# Patient Record
Sex: Male | Born: 1990 | Hispanic: Yes | Marital: Married | Smoking: Never smoker
Health system: Southern US, Community
[De-identification: ages and names within clinical notes are randomized; demographics above are authoritative.]

## PROBLEM LIST (undated history)

## (undated) DIAGNOSIS — H9190 Unspecified hearing loss, unspecified ear: Secondary | ICD-10-CM

## (undated) HISTORY — PX: HERNIA REPAIR: SHX51

## (undated) HISTORY — PX: ABDOMINAL SURGERY: SHX537

---

## 2013-03-22 ENCOUNTER — Emergency Department (HOSPITAL_COMMUNITY)
Admission: EM | Admit: 2013-03-22 | Discharge: 2013-03-22 | Disposition: A | Discharge status: Home / Self Care | Payer: Medicaid Other | Attending: Emergency Medicine | Admitting: Emergency Medicine

## 2013-03-22 DIAGNOSIS — X131XXA Other contact with steam and other hot vapors, initial encounter: Secondary | ICD-10-CM | POA: Insufficient documentation

## 2013-03-22 DIAGNOSIS — Y9389 Activity, other specified: Secondary | ICD-10-CM | POA: Insufficient documentation

## 2013-03-22 DIAGNOSIS — T2640XA Burn of unspecified eye and adnexa, part unspecified, initial encounter: Secondary | ICD-10-CM | POA: Insufficient documentation

## 2013-03-22 DIAGNOSIS — Y9289 Other specified places as the place of occurrence of the external cause: Secondary | ICD-10-CM | POA: Insufficient documentation

## 2013-03-22 DIAGNOSIS — X12XXXA Contact with other hot fluids, initial encounter: Secondary | ICD-10-CM | POA: Insufficient documentation

## 2013-03-22 DIAGNOSIS — T2010XA Burn of first degree of head, face, and neck, unspecified site, initial encounter: Secondary | ICD-10-CM

## 2013-03-22 MED ORDER — OXYCODONE-ACETAMINOPHEN 5-325 MG PO TABS
1.0000 | ORAL_TABLET | Freq: Once | ORAL | Status: AC
  Administered 2013-03-22: 1 via ORAL
  Filled 2013-03-22: qty 1

## 2013-03-22 MED ORDER — TETRACAINE HCL 0.5 % OP SOLN
2.0000 [drp] | Freq: Once | OPHTHALMIC | Status: AC
  Administered 2013-03-22: 2 [drp] via OPHTHALMIC
  Filled 2013-03-22: qty 2

## 2013-03-22 MED ORDER — HYDROCODONE-ACETAMINOPHEN 5-325 MG PO TABS: 1.0000 | ORAL_TABLET | Freq: Four times a day (QID) | ORAL | Status: DC | PRN

## 2013-03-22 MED ORDER — FLUORESCEIN SODIUM 1 MG OP STRP
1.0000 | ORAL_STRIP | Freq: Once | OPHTHALMIC | Status: AC
  Administered 2013-03-22: 20:00:00 via OPHTHALMIC
  Filled 2013-03-22: qty 1

## 2013-03-22 NOTE — ED Notes (Signed)
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  C/o pain under his eyes and above.  Redness no blistering yet.  Wife at the bedside.  She is also deaf .
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
## 2013-03-22 NOTE — ED Notes (Signed)
Family at beside. Family given emotional support. 

## 2013-03-22 NOTE — ED Notes (Signed)
The pt reports that his pain is better after the percocet.  Waiting for a reevaluation

## 2013-03-22 NOTE — ED Notes (Signed)
Family updated as to patient's status.
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
## 2013-03-22 NOTE — ED Notes (Signed)
Pain med given and material to check his eyes at the bedside

## 2013-03-22 NOTE — ED Notes (Signed)
Vital signs stable. 

## 2014-01-24 ENCOUNTER — Encounter (HOSPITAL_COMMUNITY): Payer: Self-pay | Admitting: Emergency Medicine

## 2014-01-24 ENCOUNTER — Emergency Department (HOSPITAL_COMMUNITY): Payer: Medicaid Other

## 2014-01-24 ENCOUNTER — Emergency Department (HOSPITAL_COMMUNITY)
Admission: EM | Admit: 2014-01-24 | Discharge: 2014-01-24 | Disposition: A | Discharge status: Home / Self Care | Payer: Medicaid Other | Attending: Emergency Medicine | Admitting: Emergency Medicine

## 2014-01-24 DIAGNOSIS — R519 Headache, unspecified: Secondary | ICD-10-CM

## 2014-01-24 DIAGNOSIS — R51 Headache: Secondary | ICD-10-CM | POA: Insufficient documentation

## 2014-01-24 DIAGNOSIS — H9190 Unspecified hearing loss, unspecified ear: Secondary | ICD-10-CM | POA: Insufficient documentation

## 2014-01-24 HISTORY — DX: Unspecified hearing loss, unspecified ear: H91.90

## 2014-01-24 MED ORDER — PROMETHAZINE HCL 25 MG PO TABS
25.0000 mg | ORAL_TABLET | Freq: Once | ORAL | Status: AC
  Administered 2014-01-24: 25 mg via ORAL
  Filled 2014-01-24: qty 1

## 2014-01-24 MED ORDER — KETOROLAC TROMETHAMINE 60 MG/2ML IM SOLN
60.0000 mg | Freq: Once | INTRAMUSCULAR | Status: AC
  Administered 2014-01-24: 60 mg via INTRAMUSCULAR
  Filled 2014-01-24: qty 2

## 2014-01-24 MED ORDER — OXYCODONE-ACETAMINOPHEN 5-325 MG PO TABS
1.0000 | ORAL_TABLET | Freq: Once | ORAL | Status: AC
Start: 2014-01-24 — End: 2014-01-24
  Administered 2014-01-24: 1 via ORAL
  Filled 2014-01-24: qty 1

## 2014-01-24 MED ORDER — TRAMADOL HCL 50 MG PO TABS: 50.0000 mg | ORAL_TABLET | Freq: Four times a day (QID) | ORAL | Status: DC | PRN

## 2014-01-24 MED ORDER — OXYCODONE-ACETAMINOPHEN 5-325 MG PO TABS
1.0000 | ORAL_TABLET | Freq: Once | ORAL | Status: AC
  Administered 2014-01-24: 1 via ORAL
  Filled 2014-01-24: qty 1

## 2014-01-24 NOTE — Discharge Instructions (Signed)
Migraine Headache A migraine headache is an intense, throbbing pain on one or both sides of your head. A migraine can last for 30 minutes to several hours. CAUSES  The exact cause of a migraine headache is not always known. However, a migraine may be caused when nerves in the brain become irritated and release chemicals that cause inflammation. This causes pain. Certain things may also trigger migraines, such as:  Alcohol.  Smoking.  Stress.  Menstruation.  Aged cheeses.  Foods or drinks that contain nitrates, glutamate, aspartame, or tyramine.  Lack of sleep.  Chocolate.  Caffeine.  Hunger.  Physical exertion.  Fatigue.  Medicines used to treat chest pain (nitroglycerine), birth control pills, estrogen, and some blood pressure medicines. SIGNS AND SYMPTOMS  Pain on one or both sides of your head.  Pulsating or throbbing pain.  Severe pain that prevents daily activities.  Pain that is aggravated by any physical activity.  Nausea, vomiting, or both.  Dizziness.  Pain with exposure to bright lights, loud noises, or activity.  General sensitivity to bright lights, loud noises, or smells. Before you get a migraine, you may get warning signs that a migraine is coming (aura). An aura may include:  Seeing flashing lights.  Seeing bright spots, halos, or zig-zag lines.  Having tunnel vision or blurred vision.  Having feelings of numbness or tingling.  Having trouble talking.  Having muscle weakness. DIAGNOSIS  A migraine headache is often diagnosed based on:  Symptoms.  Physical exam.  A CT scan or MRI of your head. These imaging tests cannot diagnose migraines, but they can help rule out other causes of headaches. TREATMENT Medicines may be given for pain and nausea. Medicines can also be given to help prevent recurrent migraines.  HOME CARE INSTRUCTIONS  Only take over-the-counter or prescription medicines for pain or discomfort as directed by your  health care provider. The use of long-term narcotics is not recommended.  Lie down in a dark, quiet room when you have a migraine.  Keep a journal to find out what may trigger your migraine headaches. For example, write down:  What you eat and drink.  How much sleep you get.  Any change to your diet or medicines.  Limit alcohol consumption.  Quit smoking if you smoke.  Get 7 9 hours of sleep, or as recommended by your health care provider.  Limit stress.  Keep lights dim if bright lights bother you and make your migraines worse. SEEK IMMEDIATE MEDICAL CARE IF:   Your migraine becomes severe.  You have a fever.  You have a stiff neck.  You have vision loss.  You have muscular weakness or loss of muscle control.  You start losing your balance or have trouble walking.  You feel faint or pass out.  You have severe symptoms that are different from your first symptoms. MAKE SURE YOU:   Understand these instructions.  Will watch your condition.  Will get help right away if you are not doing well or get worse. Document Released: 11/30/2005 Document Revised: 09/20/2013 Document Reviewed: 08/07/2013 Exit[employer] Patient Information 2014 [employer].  

## 2014-01-24 NOTE — ED Provider Notes (Signed)
CSN: [id number]     Arrival date & time 01/24/14  2005 History   First MD Initiated Contact with Patient 01/24/14 2130     Chief Complaint  Patient presents with  . Headache     (Consider location/radiation/quality/duration/timing/severity/associated sxs/prior Treatment) Patient is a 23 y.o. male presenting with headaches.  Headache Pain location:  L parietal and R parietal Quality:  Unable to specify Radiates to:  Does not radiate Severity currently:  3/10 Severity at highest:  5/10 Onset quality:  Gradual Duration:  2 days Timing:  Constant Progression:  Unchanged Chronicity:  New Similar to prior headaches: no   Context: not activity, not caffeine, not coughing, not eating, not intercourse and not loud noise   Relieved by:  None tried Worsened by:  Nothing tried Ineffective treatments:  None tried Associated symptoms: no abdominal pain, no back pain, no blurred vision, no cough, no ear pain, no facial pain, no fever, no nausea, no near-syncope, no neck stiffness, no numbness, no photophobia, no seizures and no weakness    Pt deaf: Patient presents to the ER with complaints of headache for the past few days. He is normally healthy and does not usually have headaches. A ASL intepretor is present through the whole interview and physical exam. He otherwise feels well and has not been affected by the headache. His girlfriend is the reasons he came, She says her mother has migraines and his headache is different then hers so she felt he needs to get checked out.  Past Medical History  Diagnosis Date  . Deaf    History reviewed. No pertinent past surgical history. No family history on file. History  Substance Use Topics  . Smoking status: Never Smoker   . Smokeless tobacco: Not on file  . Alcohol Use: No    Review of Systems  Constitutional: Negative for fever.  HENT: Negative for ear pain.   Eyes: Negative for blurred vision and photophobia.  Respiratory: Negative for  cough.   Cardiovascular: Negative for near-syncope.  Gastrointestinal: Negative for nausea and abdominal pain.  Musculoskeletal: Negative for back pain and neck stiffness.  Neurological: Positive for headaches. Negative for seizures and numbness.      Allergies  Review of patient's allergies indicates no known allergies.  Home Medications   Current Outpatient Rx  Name  Route  Sig  Dispense  Refill  . traMADol (ULTRAM) 50 MG tablet   Oral   Take 1 tablet (50 mg total) by mouth every 6 (six) hours as needed.   20 tablet   0    BP 126/51  Pulse 62  Temp(Src) 98.2 F (36.8 C) (Oral)  Resp 18  Wt 162 lb (73.483 kg)  SpO2 99% Physical Exam  Nursing note and vitals reviewed. Constitutional: He is oriented to person, place, and time. He appears well-developed and well-nourished. No distress.  HENT:  Head: Normocephalic and atraumatic.  Right Ear: External ear normal.  Left Ear: External ear normal.  Nose: Nose normal.  Mouth/Throat: Oropharynx is clear and moist. No oropharyngeal exudate.  Eyes: Pupils are equal, round, and reactive to light.  Neck: Normal range of motion. Neck supple.  Cardiovascular: Normal rate and regular rhythm.   Pulmonary/Chest: Effort normal. No respiratory distress. He has no wheezes.  Abdominal: Soft. There is no tenderness.  Neurological: He is alert and oriented to person, place, and time. He has normal strength. No cranial nerve deficit or sensory deficit. He displays a negative Romberg sign.  Skin: Skin is  warm and dry.  Psychiatric: He has a normal mood and affect. His behavior is normal.    ED Course  Procedures (including critical care time) Labs Review Labs Reviewed - No data to display Imaging Review No results found.  EKG Interpretation   None       MDM   Final diagnoses:  Headache    Patient given IM Toradol 60 mg  1 Percocet 5mg  PO and nausea medication  His headache significantly resolved. I advised to patient that  a head CT is not necessary and has high amounts of radiation. He at first wanted the scan but then after speaking with his significant other, decided that he would not get one know but come back if his headache returned or worsened.  22 y.o.Darin Peterson evaluation in the Emergency Department is complete. It has been determined that no acute conditions requiring further emergency intervention are present at this time. The patient/guardian have been advised of the diagnosis and plan. We have discussed signs and symptoms that warrant return to the ED, such as changes or worsening in symptoms.  Vital signs are stable at discharge. Filed Vitals:   01/24/14 2257  BP: 126/51  Pulse: 62  Temp: 98.2 F (36.8 C)  Resp: 18    Patient/guardian has voiced understanding and agreed to follow-up with the PCP or specialist.     Dorthula Matas, PA-C 01/25/14 1528

## 2014-01-24 NOTE — ED Notes (Signed)
Triage interview completed with assistance of sign language interpretor.the patient c/o HA onset yesterday morning, pt describes as squeezing. Pt reports tasting ?blood in mouth. Pt denies n/v/d

## 2014-01-26 NOTE — ED Provider Notes (Signed)
History/physical exam/procedure(s) were performed by non-physician practitioner and as supervising physician I was immediately available for consultation/collaboration. I have reviewed all notes and am in agreement with care and plan.   Hilario Quarryanielle S Orlandus Borowski, MD 01/26/14 [phone]

## 2014-03-09 ENCOUNTER — Encounter (HOSPITAL_COMMUNITY): Payer: Self-pay | Admitting: Emergency Medicine

## 2014-03-09 ENCOUNTER — Emergency Department (HOSPITAL_COMMUNITY): Payer: Medicaid Other

## 2014-03-09 ENCOUNTER — Emergency Department (HOSPITAL_COMMUNITY)
Admission: EM | Admit: 2014-03-09 | Discharge: 2014-03-10 | Disposition: A | Discharge status: Home / Self Care | Payer: Medicaid Other | Attending: Emergency Medicine | Admitting: Emergency Medicine

## 2014-03-09 DIAGNOSIS — Z91040 Latex allergy status: Secondary | ICD-10-CM | POA: Insufficient documentation

## 2014-03-09 DIAGNOSIS — H9190 Unspecified hearing loss, unspecified ear: Secondary | ICD-10-CM | POA: Insufficient documentation

## 2014-03-09 DIAGNOSIS — R079 Chest pain, unspecified: Secondary | ICD-10-CM

## 2014-03-09 LAB — CBG MONITORING, ED: GLUCOSE-CAPILLARY: 86 mg/dL (ref 70–99)

## 2014-03-09 MED ORDER — NAPROXEN 500 MG PO TABS: 500.0000 mg | ORAL_TABLET | Freq: Two times a day (BID) | ORAL | Status: DC

## 2014-03-09 NOTE — Discharge Instructions (Signed)
Chest Pain (Nonspecific) °It is often hard to give a specific diagnosis for the cause of chest pain. There is always a chance that your pain could be related to something serious, such as a heart attack or a blood clot in the lungs. You need to follow up with your caregiver for further evaluation. °CAUSES  °· Heartburn. °· Pneumonia or bronchitis. °· Anxiety or stress. °· Inflammation around your heart (pericarditis) or lung (pleuritis or pleurisy). °· A blood clot in the lung. °· A collapsed lung (pneumothorax). It can develop suddenly on its own (spontaneous pneumothorax) or from injury (trauma) to the chest. °· Shingles infection (herpes zoster virus). °The chest wall is composed of bones, muscles, and cartilage. Any of these can be the source of the pain. °· The bones can be bruised by injury. °· The muscles or cartilage can be strained by coughing or overwork. °· The cartilage can be affected by inflammation and become sore (costochondritis). °DIAGNOSIS  °Lab tests or other studies, such as X-rays, electrocardiography, stress testing, or cardiac imaging, may be needed to find the cause of your pain.  °TREATMENT  °· Treatment depends on what may be causing your chest pain. Treatment may include: °· Acid blockers for heartburn. °· Anti-inflammatory medicine. °· Pain medicine for inflammatory conditions. °· Antibiotics if an infection is present. °· You may be advised to change lifestyle habits. This includes stopping smoking and avoiding alcohol, caffeine, and chocolate. °· You may be advised to keep your head raised (elevated) when sleeping. This reduces the chance of acid going backward from your stomach into your esophagus. °· Most of the time, nonspecific chest pain will improve within 2 to 3 days with rest and mild pain medicine. °HOME CARE INSTRUCTIONS  °· If antibiotics were prescribed, take your antibiotics as directed. Finish them even if you start to feel better. °· For the next few days, avoid physical  activities that bring on chest pain. Continue physical activities as directed. °· Do not smoke. °· Avoid drinking alcohol. °· Only take over-the-counter or prescription medicine for pain, discomfort, or fever as directed by your caregiver. °· Follow your caregiver's suggestions for further testing if your chest pain does not go away. °· Keep any follow-up appointments you made. If you do not go to an appointment, you could develop lasting (chronic) problems with pain. If there is any problem keeping an appointment, you must call to reschedule. °SEEK MEDICAL CARE IF:  °· You think you are having problems from the medicine you are taking. Read your medicine instructions carefully. °· Your chest pain does not go away, even after treatment. °· You develop a rash with blisters on your chest. °SEEK IMMEDIATE MEDICAL CARE IF:  °· You have increased chest pain or pain that spreads to your arm, neck, jaw, back, or abdomen. °· You develop shortness of breath, an increasing cough, or you are coughing up blood. °· You have severe back or abdominal pain, feel nauseous, or vomit. °· You develop severe weakness, fainting, or chills. °· You have a fever. °THIS IS AN EMERGENCY. Do not wait to see if the pain will go away. Get medical help at once. Call your local emergency services (911 in [place] Do not drive yourself to the hospital. °MAKE SURE YOU:  °· Understand these instructions. °· Will watch your condition. °· Will get help right away if you are not doing well or get worse. °Document Released: 09/09/2005 Document Revised: 02/22/2012 Document Reviewed: 07/05/2008 °Exit[employer]® Patient Information ©2014 [employer]   [employer]. ° °

## 2014-03-09 NOTE — ED Notes (Signed)
Patient transported to X-ray 

## 2014-03-09 NOTE — ED Notes (Signed)
Bed: WA09 Expected date:  Expected time:  Means of arrival:  [facility]

## 2014-03-09 NOTE — ED Notes (Signed)
Patient is alert and oriented x3.  He is complaining of chest pain that started Monday. He states that he has never has this pain before.  He describes the pain as sharp and below His rib cage on both sides.  Currently he rates his pain 4 of 10.

## 2014-03-09 NOTE — ED Provider Notes (Signed)
CSN: [id number]     Arrival date & time 03/09/14  1954 History   First MD Initiated Contact with Patient 03/09/14 2213     Chief Complaint  Patient presents with  . Chest Pain    Patient is a 23 y.o. male presenting with chest pain. The history is provided by the patient.  Chest Pain Pain quality: sharp   Pain quality comment:  Also felt like air bubbling in the  chest Pain radiates to:  Does not radiate Pain severity:  Mild Duration:  1 hour Timing:  Intermittent Progression:  Waxing and waning Chronicity:  New Exacerbated by: better when laying down, worse with movement. Associated symptoms: no abdominal pain, no fever, no lower extremity edema and no shortness of breath     Past Medical History  Diagnosis Date  . Deaf    Past Surgical History  Procedure Laterality Date  . Abdominal surgery    . Hernia repair     History reviewed. No pertinent family history. History  Substance Use Topics  . Smoking status: Never Smoker   . Smokeless tobacco: Not on file  . Alcohol Use: No    Review of Systems  Constitutional: Negative for fever.  Respiratory: Negative for shortness of breath.   Cardiovascular: Positive for chest pain.  Gastrointestinal: Negative for abdominal pain.  All other systems reviewed and are negative.      Allergies  Latex  Home Medications   Current Outpatient Rx  Name  Route  Sig  Dispense  Refill  . naproxen (NAPROSYN) 500 MG tablet   Oral   Take 1 tablet (500 mg total) by mouth 2 (two) times daily with a meal. As needed for pain   20 tablet   0    BP 128/52  Pulse 72  Temp(Src) 98.6 F (37 C) (Oral)  Resp 20  Wt 166 lb 6.4 oz (75.479 kg)  SpO2 100% Physical Exam  Nursing note and vitals reviewed. Constitutional: He appears well-developed and well-nourished. No distress.  HENT:  Head: Normocephalic and atraumatic.  Right Ear: External ear normal.  Left Ear: External ear normal.  Eyes: Conjunctivae are normal. Right eye  exhibits no discharge. Left eye exhibits no discharge. No scleral icterus.  Neck: Neck supple. No tracheal deviation present.  Cardiovascular: Normal rate, regular rhythm and intact distal pulses.   Pulmonary/Chest: Effort normal and breath sounds normal. No stridor. No respiratory distress. He has no wheezes. He has no rales.  Abdominal: Soft. Bowel sounds are normal. He exhibits no distension. There is no tenderness. There is no rebound and no guarding.  Musculoskeletal: He exhibits no edema and no tenderness.  Neurological: He is alert. He has normal strength. No cranial nerve deficit (no facial droop, extraocular movements intact, no slurred speech) or sensory deficit. He exhibits normal muscle tone. He displays no seizure activity. Coordination normal.  Skin: Skin is warm and dry. No rash noted.  Psychiatric: He has a normal mood and affect.    ED Course  Procedures (including critical care time) Labs Review Labs Reviewed  CBG MONITORING, ED   Imaging Review Dg Chest 2 View  03/09/2014   CLINICAL DATA:  Chest pain and shortness of breath  EXAM: CHEST  2 VIEW  COMPARISON:  None.  FINDINGS: Normal heart size and mediastinal contours. No acute infiltrate or edema. No effusion or pneumothorax. No acute osseous findings.  IMPRESSION: No active cardiopulmonary disease.   Electronically Signed   By: Audry Riles.D.  On: 03/09/2014 23:07     EKG Interpretation   Date/Time:  Friday March 09 2014 20:36:04 EDT Ventricular Rate:  63 PR Interval:  137 QRS Duration: 95 QT Interval:  367 QTC Calculation: 376 R Axis:   59 Text Interpretation:  Sinus rhythm ST elev, probable normal early repol  pattern No significant change since last tracing Confirmed by Braidon Chermak  MD-J,  Hurbert Duran ([id number]) on 03/09/2014 10:28:39 PM      MDM   Final diagnoses:  Chest pain   No PNA.  EKG normal.  Pt was concerned about family history of diabetes.  CBG was normal  At this time there does not appear to be  any evidence of an acute emergency medical condition and the patient appears stable for discharge with appropriate outpatient follow up.     Celene KrasJon R Noretta Frier, MD 03/09/14 [phone]

## 2016-05-16 ENCOUNTER — Emergency Department (HOSPITAL_COMMUNITY)
Admission: EM | Admit: 2016-05-16 | Discharge: 2016-05-17 | Disposition: A | Discharge status: Home / Self Care | Payer: Medicaid Other | Attending: Emergency Medicine | Admitting: Emergency Medicine

## 2016-05-16 ENCOUNTER — Encounter (HOSPITAL_COMMUNITY): Payer: Self-pay | Admitting: *Deleted

## 2016-05-16 DIAGNOSIS — R443 Hallucinations, unspecified: Secondary | ICD-10-CM | POA: Insufficient documentation

## 2016-05-16 DIAGNOSIS — R42 Dizziness and giddiness: Secondary | ICD-10-CM | POA: Diagnosis not present

## 2016-05-16 DIAGNOSIS — R5383 Other fatigue: Secondary | ICD-10-CM | POA: Diagnosis not present

## 2016-05-16 DIAGNOSIS — Z91040 Latex allergy status: Secondary | ICD-10-CM | POA: Diagnosis not present

## 2016-05-16 DIAGNOSIS — Y9389 Activity, other specified: Secondary | ICD-10-CM | POA: Insufficient documentation

## 2016-05-16 DIAGNOSIS — H9190 Unspecified hearing loss, unspecified ear: Secondary | ICD-10-CM | POA: Diagnosis not present

## 2016-05-16 DIAGNOSIS — Y998 Other external cause status: Secondary | ICD-10-CM | POA: Diagnosis not present

## 2016-05-16 DIAGNOSIS — R410 Disorientation, unspecified: Secondary | ICD-10-CM | POA: Insufficient documentation

## 2016-05-16 DIAGNOSIS — T6591XA Toxic effect of unspecified substance, accidental (unintentional), initial encounter: Secondary | ICD-10-CM

## 2016-05-16 DIAGNOSIS — Y92009 Unspecified place in unspecified non-institutional (private) residence as the place of occurrence of the external cause: Secondary | ICD-10-CM | POA: Diagnosis not present

## 2016-05-16 DIAGNOSIS — T407X1A Poisoning by cannabis (derivatives), accidental (unintentional), initial encounter: Secondary | ICD-10-CM | POA: Insufficient documentation

## 2016-05-16 DIAGNOSIS — R109 Unspecified abdominal pain: Secondary | ICD-10-CM | POA: Insufficient documentation

## 2016-05-16 NOTE — ED Notes (Signed)
The pt is deaf  Deaf interpreter on the way here.  Emergency planning/management officerolice officer with him signs also

## 2016-05-16 NOTE — ED Notes (Signed)
The pt arrived by gems from home  Someone made a brownie with pot in it.  He is dizzy seeing bright lights and he feels tired.  He is also c/o abd pain  Iv per ems  Alert on arrival sklin warm and dry no distress

## 2016-05-16 NOTE — ED Notes (Signed)
drowsy
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
## 2016-05-17 NOTE — ED Notes (Signed)
Discharged instructions explained using sign Presenter, broadcastinglanguage translator.  Reports understanding.

## 2016-10-11 ENCOUNTER — Emergency Department (HOSPITAL_COMMUNITY)
Admission: EM | Admit: 2016-10-11 | Discharge: 2016-10-11 | Disposition: A | Discharge status: Home / Self Care | Payer: Medicaid Other | Attending: Emergency Medicine | Admitting: Emergency Medicine

## 2016-10-11 ENCOUNTER — Encounter (HOSPITAL_COMMUNITY): Payer: Self-pay

## 2016-10-11 DIAGNOSIS — H9203 Otalgia, bilateral: Secondary | ICD-10-CM | POA: Diagnosis present

## 2016-10-11 DIAGNOSIS — H6500 Acute serous otitis media, unspecified ear: Secondary | ICD-10-CM | POA: Diagnosis not present

## 2016-10-11 MED ORDER — NAPROXEN 500 MG PO TABS: 500.0000 mg | ORAL_TABLET | Freq: Two times a day (BID) | ORAL | 0 refills | Status: AC

## 2016-10-11 MED ORDER — TRAMADOL HCL 50 MG PO TABS: 50.0000 mg | ORAL_TABLET | Freq: Four times a day (QID) | ORAL | 0 refills | Status: AC | PRN

## 2016-10-11 MED ORDER — AMOXICILLIN-POT CLAVULANATE 875-125 MG PO TABS: 1.0000 | ORAL_TABLET | Freq: Two times a day (BID) | ORAL | 0 refills | Status: AC

## 2016-10-11 NOTE — Discharge Instructions (Signed)
Do not take the narcotic if driving. Follow up with your doctor for recheck to be sure the infection is improving. You may continue to take sudafed.

## 2016-10-11 NOTE — ED Triage Notes (Signed)
Pt reports sore throat, cough, and ear pain starting one week ago. Reports that sore throat and cough have mostly resolved but ear pain continues. Denies fevers or chills at home. Pt states previous hx of ear infections. Reports taking sudafed at home with some relief.

## 2016-10-11 NOTE — ED Provider Notes (Signed)
[facility]-EMERGENCY DEPT Provider Note   CSN: [id number] Arrival date & time: 10/11/16  1641   By signing my name below, I, Clarisse GougeXavier Herndon, attest that this documentation has been prepared under the direction and in the presence of [place] Neese, [place]. Electronically Signed: Clarisse GougeXavier Herndon, Scribe. 10/11/16. 7:26 PM.   History   Chief Complaint Chief Complaint  Patient presents with  . Otalgia    The history is provided by the patient. The history is limited by a language barrier. A language interpreter was used.  Otalgia  Associated symptoms include rhinorrhea and cough. Pertinent negatives include no ear discharge.   HPI Comments: Darin Peterson is a 25 y.o. male with PMHx of ear infections who presents to the Emergency Department complaining of constant, 10/10 earpain x 1 week. The pt describes the pain as pressure on both sides of jaw and reports cough and congestion beginning last Sunday. Pt further notes burning behind eyes. He states that he took sudafed for his symptoms before bed last night. The pt denies denies ear drainage or bleeding. The pt is otherwise healthy, and he does not currently take any other medications. No alleviating factors noted. Patient does note a hx of frequent ear infections in the past.    Past Medical History:  Diagnosis Date  . Deaf     There are no active problems to display for this patient.   Past Surgical History:  Procedure Laterality Date  . ABDOMINAL SURGERY    . HERNIA REPAIR         Home Medications    Prior to Admission medications   Medication Sig Start Date End Date Taking? Authorizing Provider  amoxicillin-clavulanate (AUGMENTIN) 875-125 MG tablet Take 1 tablet by mouth 2 (two) times daily. 10/11/16   Hope Orlene OchM Neese, NP  naproxen (NAPROSYN) 500 MG tablet Take 1 tablet (500 mg total) by mouth 2 (two) times daily. 10/11/16   Hope Orlene OchM Neese, NP  traMADol (ULTRAM) 50 MG tablet Take 1 tablet (50 mg total) by mouth every 6 (six) hours as  needed. 10/11/16   Hope Orlene OchM Neese, NP    Family History History reviewed. No pertinent family history.  Social History Social History  Substance Use Topics  . Smoking status: Never Smoker  . Smokeless tobacco: Never Used  . Alcohol use Yes     Allergies   Latex   Review of Systems Review of Systems  HENT: Positive for congestion, ear pain, rhinorrhea, sinus pressure and tinnitus. Negative for ear discharge.   Eyes: Positive for pain (burning behind eyes).  Respiratory: Positive for cough.   all other systems negative   Physical Exam Updated Vital Signs BP 154/83 (BP Location: Right Arm)   Pulse 68   Temp 98.4 F (36.9 C) (Oral)   Resp 16   Ht 5\' 9"  (1.753 m)   Wt 86.6 kg   SpO2 99%   BMI 28.21 kg/m   Physical Exam  Constitutional: He is oriented to person, place, and time. He appears well-developed and well-nourished.  HENT:  Head: Normocephalic and atraumatic.  Right Ear: No mastoid tenderness. Tympanic membrane is erythematous and bulging.  Left Ear: No mastoid tenderness. Tympanic membrane is erythematous.  Nose: Right sinus exhibits maxillary sinus tenderness. Left sinus exhibits maxillary sinus tenderness.  Eyes: EOM are normal. Pupils are equal, round, and reactive to light.  Neck: Normal range of motion. Neck supple.  Cardiovascular: Normal rate and regular rhythm.   Pulmonary/Chest: Effort normal and breath sounds normal. No respiratory  distress.  Abdominal: Soft. He exhibits no distension. There is no tenderness.  Musculoskeletal: Normal range of motion.  Lymphadenopathy:    He has cervical adenopathy.  Neurological: He is alert and oriented to person, place, and time.  Skin: Skin is warm and dry.  Psychiatric: He has a normal mood and affect. Judgment normal.  Nursing note and vitals reviewed.    ED Treatments / Results  DIAGNOSTIC STUDIES: Oxygen Saturation is 97% on RA, normal by my interpretation.    COORDINATION OF CARE: 7:26 PM Will  order antibiotics and pain medication, advised pt to FU with PCP. Discussed treatment plan with pt at bedside and pt agreed to plan.    Labs (all labs ordered are listed, but only abnormal results are displayed) Labs Reviewed - No data to display  Radiology No results found.  Procedures Procedures (including critical care time)  Medications Ordered in ED Medications - No data to display   Initial Impression / Assessment and Plan / ED Course  I have reviewed the triage vital signs and the nursing notes.   Clinical Course  25 y.o. male with URI symptoms x 1 week and tonight bilateral ear pain stable for d/c without meningeal signs, no fever and no TM rupture. Discussed with the patient and all questioned fully answered. He will f/u with his PCP to recheck the ear later this week. He will return here if any problems arise.   Final Clinical Impressions(s) / ED Diagnoses   Final diagnoses:  Acute serous otitis media, recurrence not specified, unspecified laterality    New Prescriptions Discharge Medication List as of 10/11/2016  7:10 PM    START taking these medications   Details  amoxicillin-clavulanate (AUGMENTIN) 875-125 MG tablet Take 1 tablet by mouth 2 (two) times daily., Starting Sun 10/11/2016, Print    traMADol (ULTRAM) 50 MG tablet Take 1 tablet (50 mg total) by mouth every 6 (six) hours as needed., Starting Sun 10/11/2016, Print      I personally performed the services described in this documentation, which was scribed in my presence. The recorded information has been reviewed and is accurate.     [address]Hope Wayne [place] Neese, [place] 10/12/16 [id number]    Lyndal Pulleyaniel Knott, MD 10/12/16 [phone]

## 2017-02-23 ENCOUNTER — Emergency Department (HOSPITAL_COMMUNITY): Payer: No Typology Code available for payment source

## 2017-02-23 ENCOUNTER — Encounter (HOSPITAL_COMMUNITY): Payer: Self-pay

## 2017-02-23 ENCOUNTER — Emergency Department (HOSPITAL_COMMUNITY)
Admission: EM | Admit: 2017-02-23 | Discharge: 2017-02-23 | Disposition: A | Discharge status: Home / Self Care | Payer: No Typology Code available for payment source | Attending: Emergency Medicine | Admitting: Emergency Medicine

## 2017-02-23 DIAGNOSIS — S161XXA Strain of muscle, fascia and tendon at neck level, initial encounter: Secondary | ICD-10-CM

## 2017-02-23 DIAGNOSIS — Y92410 Unspecified street and highway as the place of occurrence of the external cause: Secondary | ICD-10-CM | POA: Diagnosis not present

## 2017-02-23 DIAGNOSIS — S199XXA Unspecified injury of neck, initial encounter: Secondary | ICD-10-CM | POA: Diagnosis present

## 2017-02-23 DIAGNOSIS — Z91040 Latex allergy status: Secondary | ICD-10-CM | POA: Insufficient documentation

## 2017-02-23 DIAGNOSIS — R1031 Right lower quadrant pain: Secondary | ICD-10-CM | POA: Insufficient documentation

## 2017-02-23 DIAGNOSIS — M62838 Other muscle spasm: Secondary | ICD-10-CM

## 2017-02-23 DIAGNOSIS — Y999 Unspecified external cause status: Secondary | ICD-10-CM | POA: Insufficient documentation

## 2017-02-23 DIAGNOSIS — S301XXA Contusion of abdominal wall, initial encounter: Secondary | ICD-10-CM | POA: Diagnosis not present

## 2017-02-23 DIAGNOSIS — Y939 Activity, unspecified: Secondary | ICD-10-CM | POA: Insufficient documentation

## 2017-02-23 LAB — I-STAT CHEM 8, ED
BUN: 21 mg/dL — AB (ref 6–20)
CHLORIDE: 102 mmol/L (ref 101–111)
CREATININE: 1.1 mg/dL (ref 0.61–1.24)
Calcium, Ion: 1.23 mmol/L (ref 1.15–1.40)
Glucose, Bld: 95 mg/dL (ref 65–99)
HEMATOCRIT: 46 % (ref 39.0–52.0)
HEMOGLOBIN: 15.6 g/dL (ref 13.0–17.0)
Potassium: 4 mmol/L (ref 3.5–5.1)
Sodium: 138 mmol/L (ref 135–145)
TCO2: 27 mmol/L (ref 0–100)

## 2017-02-23 MED ORDER — NAPROXEN 500 MG PO TABS: 500.0000 mg | ORAL_TABLET | Freq: Two times a day (BID) | ORAL | 0 refills | Status: AC | PRN

## 2017-02-23 MED ORDER — CYCLOBENZAPRINE HCL 10 MG PO TABS: 10.0000 mg | ORAL_TABLET | Freq: Three times a day (TID) | ORAL | 0 refills | Status: AC | PRN

## 2017-02-23 MED ORDER — HYDROCODONE-ACETAMINOPHEN 5-325 MG PO TABS
1.0000 | ORAL_TABLET | Freq: Once | ORAL | Status: AC
  Administered 2017-02-23: 1 via ORAL
  Filled 2017-02-23: qty 1

## 2017-02-23 MED ORDER — IOPAMIDOL (ISOVUE-300) INJECTION 61%
100.0000 mL | Freq: Once | INTRAVENOUS | Status: AC | PRN
  Administered 2017-02-23: 100 mL via INTRAVENOUS

## 2017-02-23 MED ORDER — IOPAMIDOL (ISOVUE-300) INJECTION 61%
INTRAVENOUS | Status: AC
  Filled 2017-02-23: qty 100
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
## 2017-02-23 NOTE — Discharge Instructions (Signed)
Take naprosyn as directed for inflammation and pain with tylenol for breakthrough pain and flexeril for muscle relaxation. Do not drive or operate machinery with muscle relaxant use. Ice to areas of soreness for the next 24 hours and then may move to heat, no more than 20 minutes at a time every hour for each. Expect to be sore for the next few days and follow up with primary care physician for recheck of ongoing symptoms in the next 1-2 weeks. Return to ER for emergent changing or worsening of symptoms.  °  °

## 2017-02-23 NOTE — ED Triage Notes (Signed)
Patient states being anxious.
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
## 2019-08-10 ENCOUNTER — Other Ambulatory Visit: Payer: Self-pay

## 2019-08-10 DIAGNOSIS — Z20822 Contact with and (suspected) exposure to covid-19: Secondary | ICD-10-CM

## 2019-08-12 LAB — NOVEL CORONAVIRUS, NAA: SARS-CoV-2, NAA: NOT DETECTED

## 2019-12-19 ENCOUNTER — Ambulatory Visit: Payer: Self-pay | Attending: Internal Medicine

## 2019-12-19 DIAGNOSIS — Z20822 Contact with and (suspected) exposure to covid-19: Secondary | ICD-10-CM | POA: Insufficient documentation

## 2019-12-20 LAB — NOVEL CORONAVIRUS, NAA: SARS-CoV-2, NAA: NOT DETECTED

## 2020-02-05 ENCOUNTER — Ambulatory Visit: Payer: Self-pay | Attending: Internal Medicine

## 2020-02-05 DIAGNOSIS — Z20822 Contact with and (suspected) exposure to covid-19: Secondary | ICD-10-CM

## 2020-02-06 LAB — NOVEL CORONAVIRUS, NAA: SARS-CoV-2, NAA: NOT DETECTED

## 2020-07-25 ENCOUNTER — Other Ambulatory Visit: Payer: Self-pay

## 2020-07-25 DIAGNOSIS — Z20822 Contact with and (suspected) exposure to covid-19: Secondary | ICD-10-CM

## 2020-07-26 LAB — SARS-COV-2, NAA 2 DAY TAT

## 2020-07-26 LAB — NOVEL CORONAVIRUS, NAA: SARS-CoV-2, NAA: DETECTED — AB

## 2020-07-27 ENCOUNTER — Telehealth: Payer: Self-pay | Admitting: Physician Assistant

## 2020-07-27 NOTE — Telephone Encounter (Signed)
Called to discuss with patient about Covid symptoms and the use of bamlanivimab/etesevimab or casirivimab/imdevimab, a monoclonal antibody infusion for those with mild to moderate Covid symptoms and at a high risk of hospitalization.  Pt is qualified for this infusion at the [facility] due to; Specific high risk criteria : BMI > 25   Pt is listed as deaf. I have sent him a Mychart message.   Cline Crock PA-C  [facility]

## 2020-07-30 ENCOUNTER — Other Ambulatory Visit: Payer: Self-pay

## 2020-08-09 ENCOUNTER — Other Ambulatory Visit: Payer: Self-pay | Admitting: Critical Care Medicine

## 2020-08-09 ENCOUNTER — Other Ambulatory Visit: Payer: Self-pay

## 2020-08-09 DIAGNOSIS — Z20822 Contact with and (suspected) exposure to covid-19: Secondary | ICD-10-CM

## 2020-08-10 LAB — NOVEL CORONAVIRUS, NAA: SARS-CoV-2, NAA: NOT DETECTED

## 2020-08-10 LAB — SARS-COV-2, NAA 2 DAY TAT

## 2021-01-06 ENCOUNTER — Other Ambulatory Visit: Payer: Self-pay

## 2021-01-06 DIAGNOSIS — Z20822 Contact with and (suspected) exposure to covid-19: Secondary | ICD-10-CM

## 2021-01-07 LAB — NOVEL CORONAVIRUS, NAA: SARS-CoV-2, NAA: NOT DETECTED

## 2021-01-07 LAB — SARS-COV-2, NAA 2 DAY TAT

## 2024-02-01 ENCOUNTER — Encounter (HOSPITAL_BASED_OUTPATIENT_CLINIC_OR_DEPARTMENT_OTHER): Payer: Self-pay

## 2024-02-01 ENCOUNTER — Telehealth (HOSPITAL_BASED_OUTPATIENT_CLINIC_OR_DEPARTMENT_OTHER): Payer: Self-pay | Admitting: Physical Therapy
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
## 2024-02-02 ENCOUNTER — Ambulatory Visit (HOSPITAL_BASED_OUTPATIENT_CLINIC_OR_DEPARTMENT_OTHER): Payer: Medicaid Other | Admitting: Physical Therapy

## 2024-02-02 NOTE — Therapy (Incomplete)
OUTPATIENT PHYSICAL THERAPY CERVICAL EVALUATION   Patient Name: Darin Peterson MRN: [id number] DOB:23-Apr-1991, 33 y.o., male Today's Date: 02/02/2024  END OF SESSION:   Past Medical History:  Diagnosis Date   Deaf    Past Surgical History:  Procedure Laterality Date   ABDOMINAL SURGERY     HERNIA REPAIR     There are no active problems to display for this patient.   PCP: ***  REFERRING PROVIDER: Starr Sinclair, PA-C  REFERRING DIAG: G55.86 (ICD-10-CM) - Cervicogenic headache  THERAPY DIAG:  No diagnosis found.  Rationale for Evaluation and Treatment: {HABREHAB:27488}  ONSET DATE: ***  SUBJECTIVE:                                                                                                                                                                                                         SUBJECTIVE STATEMENT: Pt wakes up with neck pain.  He has tightness and stiffness in neck.  H has pain around his eyes and pressure behind the eyes.  Pt saw MD on 1/10 and PT order 12/24/23  mm relaxers? Pt gets migraines  Hand dominance: {MISC; OT HAND DOMINANCE:[phone]}  PERTINENT HISTORY:  Pt is deaf or (hearing impaired)--Sign language interpreter needed Migraines with aura Class 1 obesity  PAIN:  Are you having pain? {OPRCPAIN:27236}  PRECAUTIONS: {Therapy precautions:24002}  RED FLAGS: {PT Red Flags:29287}     WEIGHT BEARING RESTRICTIONS: {Yes ***/No:24003}  FALLS:  Has patient fallen in last 6 months? {fallsyesno:27318}  LIVING ENVIRONMENT: Lives with: {OPRC lives with:25569::"lives with their family"} Lives in: {Lives in:25570} Stairs: {opstairs:27293} Has following equipment at home: {Assistive devices:23999}  OCCUPATION: ***  PLOF: {PLOF:24004}  PATIENT GOALS: ***  NEXT MD VISIT: ***  OBJECTIVE:  Note: Objective measures were completed at Evaluation unless otherwise noted.  DIAGNOSTIC FINDINGS:  Cervical X rays in 2018: IMPRESSION: No  demonstrable fracture or spondylolisthesis. No appreciable arthropathy. Note that no attempt at assessing for potential ligamentous injury can be made with in collar only images.  PATIENT SURVEYS:  {rehab surveys:24030}  COGNITION: Overall cognitive status: {cognition:24006}  SENSATION: {sensation:27233}  POSTURE: {posture:25561}  PALPATION: ***   CERVICAL ROM:   {AROM/PROM:27142} ROM A/PROM (deg) eval  Flexion   Extension   Right lateral flexion   Left lateral flexion   Right rotation   Left rotation    (Blank rows = not tested)  UPPER EXTREMITY ROM:  {AROM/PROM:27142} ROM Right eval Left eval  Shoulder flexion    Shoulder extension    Shoulder abduction    Shoulder adduction  Shoulder extension    Shoulder internal rotation    Shoulder external rotation    Elbow flexion    Elbow extension    Wrist flexion    Wrist extension    Wrist ulnar deviation    Wrist radial deviation    Wrist pronation    Wrist supination     (Blank rows = not tested)  UPPER EXTREMITY MMT:  MMT Right eval Left eval  Shoulder flexion    Shoulder extension    Shoulder abduction    Shoulder adduction    Shoulder extension    Shoulder internal rotation    Shoulder external rotation    Middle trapezius    Lower trapezius    Elbow flexion    Elbow extension    Wrist flexion    Wrist extension    Wrist ulnar deviation    Wrist radial deviation    Wrist pronation    Wrist supination    Grip strength     (Blank rows = not tested)  CERVICAL SPECIAL TESTS:  {Cervical special tests:25246}  FUNCTIONAL TESTS:  {Functional tests:24029}  TREATMENT DATE: ***                                                                                                                                 PATIENT EDUCATION:  Education details: *** Person educated: {Person educated:25204} Education method: {Education Method:25205} Education comprehension: {Education  Comprehension:25206}  HOME EXERCISE PROGRAM: ***  ASSESSMENT:  CLINICAL IMPRESSION: Patient is a *** y.o. *** who was seen today for physical therapy evaluation and treatment for ***.   OBJECTIVE IMPAIRMENTS: {opptimpairments:25111}.   ACTIVITY LIMITATIONS: {activitylimitations:27494}  PARTICIPATION LIMITATIONS: {participationrestrictions:25113}  PERSONAL FACTORS: {Personal factors:25162} are also affecting patient's functional outcome.   REHAB POTENTIAL: {rehabpotential:25112}  CLINICAL DECISION MAKING: {clinical decision making:25114}  EVALUATION COMPLEXITY: {Evaluation complexity:25115}   GOALS: Goals reviewed with patient? {yes/no:20286}  SHORT TERM GOALS: Target date: ***  *** Baseline:  Goal status: INITIAL  2.  *** Baseline:  Goal status: INITIAL  3.  *** Baseline:  Goal status: INITIAL  4.  *** Baseline:  Goal status: INITIAL  5.  *** Baseline:  Goal status: INITIAL  6.  *** Baseline:  Goal status: INITIAL  LONG TERM GOALS: Target date: ***  *** Baseline:  Goal status: INITIAL  2.  *** Baseline:  Goal status: INITIAL  3.  *** Baseline:  Goal status: INITIAL  4.  *** Baseline:  Goal status: INITIAL  5.  *** Baseline:  Goal status: INITIAL  6.  *** Baseline:  Goal status: INITIAL   PLAN:  PT FREQUENCY: {rehab frequency:25116}  PT DURATION: {rehab duration:25117}  PLANNED INTERVENTIONS: {rehab planned interventions:25118::"97110-Therapeutic exercises","97530- Therapeutic [phone]- Neuromuscular re-education","97535- Self MWNU","27253- Manual therapy"}  PLAN FOR NEXT SESSION: Aaron Edelman, PT 02/02/2024, 7:01 AM

## 2024-02-10 ENCOUNTER — Other Ambulatory Visit: Payer: Self-pay

## 2024-02-10 ENCOUNTER — Ambulatory Visit (HOSPITAL_BASED_OUTPATIENT_CLINIC_OR_DEPARTMENT_OTHER): Payer: Medicaid Other | Attending: Physician Assistant | Admitting: Physical Therapy

## 2024-02-10 DIAGNOSIS — G4486 Cervicogenic headache: Secondary | ICD-10-CM | POA: Insufficient documentation

## 2024-02-10 NOTE — Therapy (Unsigned)
 OUTPATIENT PHYSICAL THERAPY CERVICAL EVALUATION   Patient Name: Darin Peterson MRN: [id number] DOB:1991-12-11, 33 y.o., male Today's Date: 02/11/2024  END OF SESSION:  PT End of Session - 02/10/24 1339     Visit Number 1    Date for PT Re-Evaluation 03/10/24    Authorization Type medicaid    PT Start Time 1020    PT Stop Time 1100    PT Time Calculation (min) 40 min    Activity Tolerance Patient tolerated treatment well    Behavior During Therapy WFL for tasks assessed/performed             Past Medical History:  Diagnosis Date   Deaf    Past Surgical History:  Procedure Laterality Date   ABDOMINAL SURGERY     HERNIA REPAIR     There are no active problems to display for this patient.   PCP: Starr Sinclair, PA-C  REFERRING PROVIDER: Starr Sinclair, PA-C  REFERRING DIAG: [phone] (ICD-10-CM) - Cervicogenic headache  THERAPY DIAG:  Cervicogenic headache  Rationale for Evaluation and Treatment: Rehabilitation  ONSET DATE: 6 months  SUBJECTIVE:                                                                                                                                                                                                         SUBJECTIVE STATEMENT: Pt wakes up with neck pain.  He has tightness and stiffness in neck.  H has pain around his eyes and pressure behind the eyes.  Pt saw MD on 1/10 and PT order 12/24/23. Pt gets migraines  Hand dominance: Right  PERTINENT HISTORY:  Pt is deaf or (hearing impaired)--Sign language interpreter needed Migraines with aura Class 1 obesity  PAIN:  Are you having pain? Yes: NPRS scale: current 3/10; worst 8/10; least 1/10 Pain location: base of head in back; behind eye/migraines Pain description: ache constant Aggravating factors: light, in am getting OOB Relieving factors: movement, migraine meds, massage  PRECAUTIONS: None  RED FLAGS: None     WEIGHT BEARING RESTRICTIONS: no  FALLS:  Has  patient fallen in last 6 months? No  LIVING ENVIRONMENT: Lives with: lives with their family Lives in: House/apartment   PLOF: Independent  PATIENT GOALS: pain relief  NEXT MD VISIT: as needed  OBJECTIVE:  Note: Objective measures were completed at Evaluation unless otherwise noted.  DIAGNOSTIC FINDINGS:  Cervical X rays in 2018: IMPRESSION: No demonstrable fracture or spondylolisthesis. No appreciable arthropathy. Note that no attempt at assessing for potential ligamentous injury can be made with in collar only  images.   COGNITION: Overall cognitive status: Within functional limits for tasks assessed  SENSATION: WFL  POSTURE: No Significant postural limitations  PALPATION: Mild discomfort with palpation in suboccipal ms And at insertion of SCM ms/mastoid L>R  CERVICAL ROM:  full  UPPER EXTREMITY ROM:  full  UPPER EXTREMITY MMT:  wnl  CERVICAL SPECIAL TESTS:  Cranial cervical flexion test: Negative   TREATMENT  Eval Self care:  EDU migraines: symptoms; causes; treatment.  Use of modalities; altering light; exercises for sub occipital stretching and cervical ROM                                                                                                                      PATIENT EDUCATION:  Education details: Discussed eval findings, rehab rationale.. Patient is in agreement  Person educated: Patient Education method: Explanation and via interpreter for hearing impaired Education comprehension: verbalized understanding  HOME EXERCISE PROGRAM: Access Code: [email or web address] URL: [email or web address] Date: 02/10/2024 Prepared by: Geni Bers  Exercises - Seated Cervical Retraction  - 1 x daily - 7 x weekly - 3 sets - 10 reps - Seated Cervical Retraction and Rotation  - 1 x daily - 7 x weekly - 3 sets - 10 reps - Supine Chin Tuck  - 1 x daily - 7 x weekly - 3 sets - 10 reps  ASSESSMENT:  CLINICAL IMPRESSION: Patient is a 33 y.o. m  who was seen today for physical therapy evaluation and treatment for Cervicogenic headache. Pt is hearing impaired. He uses an interpreter for communication.  Reports new onset of Migraines in past 6 months with associated cervical pain.  He presents in very good physical fitness with full cervical ROM and strength.  He has slight TTP in suboccipital muscles as well as the insertion of the SCM/mastoid area with left slightly more tender than right.  It is likely pain is associated directly due to the onset of the migraines versus the other way around. He does not report any injury or trauma.  All symptoms appear to be "migraine like"  OBJECTIVE IMPAIRMENTS: pain.    REHAB POTENTIAL: Good  CLINICAL DECISION MAKING: Stable/uncomplicated  EVALUATION COMPLEXITY: Low   GOALS: Goals reviewed with patient? Yes  SHORT TERM GOALS: Target date: 03/10/24  Pt to report decrease in suboccipital pain by 75% Baseline: chart Goal status: INITIAL  2.  Pt to be indep with HEP Baseline:  Goal status: INITIAL  3.  Pt to report waking without cervical pain Baseline: see chart Goal status: INITIAL  4.  Pt to maintain full ROM oc cervical spine Baseline:  Goal status: INITIAL    PLAN:  PT FREQUENCY: 1x/week  PT DURATION: 4 weeks  PLANNED INTERVENTIONS: 97164- PT Re-evaluation, 97110-Therapeutic exercises, 97530- Therapeutic activity, 97112- Neuromuscular re-education, 97535- Self Care, 54098- Manual therapy, 97033- Ionotophoresis 4mg /ml Dexamethasone, Patient/Family education, Taping, Dry Needling, Joint mobilization, DME instructions, Cryotherapy, and Moist heat  PLAN FOR NEXT SESSION: land based: consider DN; Progress HEP; pain management   Corrie Dandy (  Tomma Lightning) Heavyn Yearsley MPT 02/11/24 1:12 PM [facility] [address] [place], [place], [address] Phone: [phone]   Fax:  [phone]   For all possible CPT codes, reference the Planned  Interventions line above.     Check all conditions that are expected to impact treatment: {Conditions expected to impact treatment:Uncorrected hearing or vision impairment   If treatment provided at initial evaluation, no treatment charged due to lack of authorization.

## 2024-02-11 ENCOUNTER — Encounter (HOSPITAL_BASED_OUTPATIENT_CLINIC_OR_DEPARTMENT_OTHER): Payer: Self-pay | Admitting: Physical Therapy

## 2024-02-14 ENCOUNTER — Ambulatory Visit (HOSPITAL_BASED_OUTPATIENT_CLINIC_OR_DEPARTMENT_OTHER): Payer: Medicaid Other | Attending: Physician Assistant | Admitting: Physical Therapy

## 2024-02-14 DIAGNOSIS — G4486 Cervicogenic headache: Secondary | ICD-10-CM | POA: Diagnosis present

## 2024-02-14 NOTE — Therapy (Signed)
 OUTPATIENT PHYSICAL THERAPY CERVICAL TREATMENT   Patient Name: Darin Peterson MRN: [id number] DOB:May 15, 1991, 33 y.o., male Today's Date: 02/14/2024  END OF SESSION:  PT End of Session - 02/14/24 1102     Visit Number 2    Number of Visits 4    Date for PT Re-Evaluation 03/10/24    Authorization Type medicaid    PT Start Time 1102    PT Stop Time 1142    PT Time Calculation (min) 40 min    Activity Tolerance Patient tolerated treatment well    Behavior During Therapy WFL for tasks assessed/performed              Past Medical History:  Diagnosis Date   Deaf    Past Surgical History:  Procedure Laterality Date   ABDOMINAL SURGERY     HERNIA REPAIR     There are no active problems to display for this patient.   PCP: Starr Sinclair, PA-C  REFERRING PROVIDER: Starr Sinclair, PA-C  REFERRING DIAG: [phone] (ICD-10-CM) - Cervicogenic headache  THERAPY DIAG:  Cervicogenic headache  Rationale for Evaluation and Treatment: Rehabilitation  ONSET DATE: 6 months  SUBJECTIVE:                                                                                                                                                                                                         SUBJECTIVE STATEMENT: Pt wakes up with neck pain.  He has tightness and stiffness in neck.  H has pain around his eyes and pressure behind the eyes.  Pt saw MD on 1/10 and PT order 12/24/23. Pt gets migraines  Hand dominance: Right  PERTINENT HISTORY:  Pt is deaf or (hearing impaired)--Sign language interpreter needed Migraines with aura Class 1 obesity  PAIN:  Are you having pain? Yes: NPRS scale: current 3/10; worst 8/10; least 1/10 Pain location: base of head in back; behind eye/migraines Pain description: ache constant Aggravating factors: light, in am getting OOB Relieving factors: movement, migraine meds, massage  PRECAUTIONS: None  RED FLAGS: None     WEIGHT BEARING RESTRICTIONS:  no  FALLS:  Has patient fallen in last 6 months? No  LIVING ENVIRONMENT: Lives with: lives with their family Lives in: House/apartment   PLOF: Independent  PATIENT GOALS: pain relief  NEXT MD VISIT: as needed  OBJECTIVE:  Note: Objective measures were completed at Evaluation unless otherwise noted.  DIAGNOSTIC FINDINGS:  Cervical X rays in 2018: IMPRESSION: No demonstrable fracture or spondylolisthesis. No appreciable arthropathy. Note that no attempt at assessing for potential ligamentous  injury can be made with in collar only images.   COGNITION: Overall cognitive status: Within functional limits for tasks assessed  SENSATION: WFL  POSTURE: No Significant postural limitations  PALPATION: Mild discomfort with palpation in suboccipal ms And at insertion of SCM ms/mastoid L>R  CERVICAL ROM:  full  UPPER EXTREMITY ROM:  full  UPPER EXTREMITY MMT:  wnl  CERVICAL SPECIAL TESTS:  Cranial cervical flexion test: Negative   TREATMENT  02/14/24: - STM to paraspinals, sub occiput muscles, UT.  - Trigger Point Dry Needling  Initial Treatment: Pt instructed on Dry Needling rational, procedures, and possible side effects. Pt instructed to expect mild to moderate muscle soreness later in the day and/or into the next day.  Pt instructed in methods to reduce muscle soreness. Pt instructed to continue prescribed HEP.  Patient Verbal Consent Given: Yes Education Handout Provided: Yes Muscles Treated: Bilat UT Electrical Stimulation Performed: No Treatment Response/Outcome: Decreased tension, no adverse effects.  - Rows BTB x20, cues for scap squeeze due to over use of UT's.  - ER with BTB x20, bilat  - Discussed increased water intake, use of heat.   Eval Self care:  EDU migraines: symptoms; causes; treatment.  Use of modalities; altering light; exercises for sub occipital stretching and cervical ROM                                                                                                                       PATIENT EDUCATION:  Education details: Discussed eval findings, rehab rationale.. Patient is in agreement  Person educated: Patient Education method: Explanation and via interpreter for hearing impaired Education comprehension: verbalized understanding  HOME EXERCISE PROGRAM: Access Code: [email or web address] URL: [email or web address] Date: 02/10/2024 Prepared by: Geni Bers  Exercises - Seated Cervical Retraction  - 1 x daily - 7 x weekly - 3 sets - 10 reps - Seated Cervical Retraction and Rotation  - 1 x daily - 7 x weekly - 3 sets - 10 reps - Supine Chin Tuck  - 1 x daily - 7 x weekly - 3 sets - 10 reps  ASSESSMENT:  CLINICAL IMPRESSION: Pt tolerated all prescribed exercises well with no adverse effects. Challenged with rows and ER. Pt found Dry needling very beneficial.   OBJECTIVE IMPAIRMENTS: pain.    REHAB POTENTIAL: Good  CLINICAL DECISION MAKING: Stable/uncomplicated  EVALUATION COMPLEXITY: Low   GOALS: Goals reviewed with patient? Yes  SHORT TERM GOALS: Target date: 03/10/24  Pt to report decrease in suboccipital pain by 75% Baseline: chart Goal status: INITIAL  2.  Pt to be indep with HEP Baseline:  Goal status: INITIAL  3.  Pt to report waking without cervical pain Baseline: see chart Goal status: INITIAL  4.  Pt to maintain full ROM oc cervical spine Baseline:  Goal status: INITIAL    PLAN:  PT FREQUENCY: 1x/week  PT DURATION: 4 weeks  PLANNED INTERVENTIONS: 97164- PT Re-evaluation, 97110-Therapeutic exercises, 97530- Therapeutic activity, 97112- Neuromuscular re-education, 97535- Self Care, 54098- Manual therapy,  [address]- Ionotophoresis 4mg /ml Dexamethasone, Patient/Family education, Taping, Dry Needling, Joint mobilization, DME instructions, Cryotherapy, and Moist heat  PLAN FOR NEXT SESSION: land based: consider DN; Progress HEP; pain management  Royal Hawthorn PT,  DPT 02/14/24  12:00 PM   [facility] [address] [place], [place], [address] Phone: [phone]   Fax:  [phone]   For all possible CPT codes, reference the Planned Interventions line above.     Check all conditions that are expected to impact treatment: {Conditions expected to impact treatment:Uncorrected hearing or vision impairment   If treatment provided at initial evaluation, no treatment charged due to lack of authorization.

## 2024-02-14 NOTE — Patient Instructions (Signed)

## 2024-02-29 ENCOUNTER — Ambulatory Visit (HOSPITAL_BASED_OUTPATIENT_CLINIC_OR_DEPARTMENT_OTHER): Payer: Medicaid Other | Admitting: Physical Therapy
